# Patient Record
Sex: Female | Born: 1960 | Race: Black or African American | Hispanic: No | Marital: Single | Smoking: Never smoker
Health system: Southern US, Community
[De-identification: ages and names within clinical notes are randomized; demographics above are authoritative.]

## PROBLEM LIST (undated history)

## (undated) HISTORY — PX: ABDOMINAL HYSTERECTOMY: SHX81

---

## 2020-11-20 ENCOUNTER — Other Ambulatory Visit: Payer: Self-pay

## 2020-11-20 ENCOUNTER — Emergency Department (HOSPITAL_BASED_OUTPATIENT_CLINIC_OR_DEPARTMENT_OTHER)
Admission: EM | Admit: 2020-11-20 | Discharge: 2020-11-20 | Disposition: A | Discharge status: Home / Self Care | Payer: BC Managed Care – PPO | Attending: Emergency Medicine | Admitting: Emergency Medicine

## 2020-11-20 ENCOUNTER — Encounter (HOSPITAL_BASED_OUTPATIENT_CLINIC_OR_DEPARTMENT_OTHER): Payer: Self-pay | Admitting: Emergency Medicine

## 2020-11-20 ENCOUNTER — Emergency Department (HOSPITAL_BASED_OUTPATIENT_CLINIC_OR_DEPARTMENT_OTHER): Payer: BC Managed Care – PPO

## 2020-11-20 DIAGNOSIS — R519 Headache, unspecified: Secondary | ICD-10-CM | POA: Insufficient documentation

## 2020-11-20 DIAGNOSIS — R030 Elevated blood-pressure reading, without diagnosis of hypertension: Secondary | ICD-10-CM | POA: Diagnosis not present

## 2020-11-20 DIAGNOSIS — R0789 Other chest pain: Secondary | ICD-10-CM | POA: Diagnosis present

## 2020-11-20 LAB — BASIC METABOLIC PANEL
Anion gap: 9 (ref 5–15)
BUN: 16 mg/dL (ref 6–20)
CO2: 27 mmol/L (ref 22–32)
Calcium: 8.6 mg/dL — ABNORMAL LOW (ref 8.9–10.3)
Chloride: 103 mmol/L (ref 98–111)
Creatinine, Ser: 0.86 mg/dL (ref 0.44–1.00)
GFR, Estimated: >60 mL/min (ref >60)
Glucose, Bld: 102 mg/dL — ABNORMAL HIGH (ref 70–99)
Potassium: 3.8 mmol/L (ref 3.5–5.1)
Sodium: 139 mmol/L (ref 135–145)

## 2020-11-20 LAB — CBC
HCT: 38.2 % (ref 36.0–46.0)
Hemoglobin: 12.4 g/dL (ref 12.0–15.0)
MCH: 26.1 pg (ref 26.0–34.0)
MCHC: 32.5 g/dL (ref 30.0–36.0)
MCV: 80.3 fL (ref 80.0–100.0)
Platelets: 349 10*3/uL (ref 150–400)
RBC: 4.76 MIL/uL (ref 3.87–5.11)
RDW: 14.1 % (ref 11.5–15.5)
WBC: 5.3 10*3/uL (ref 4.0–10.5)
nRBC: 0 % (ref 0.0–0.2)

## 2020-11-20 LAB — TROPONIN I (HIGH SENSITIVITY)
Troponin I (High Sensitivity): 3 ng/L (ref <18)
Troponin I (High Sensitivity): 3 ng/L (ref <18)

## 2020-11-20 MED ORDER — ALUM & MAG HYDROXIDE-SIMETH 200-200-20 MG/5ML PO SUSP
30.0000 mL | Freq: Once | ORAL | Status: AC
  Administered 2020-11-20: 23:00:00 30 mL via ORAL
  Filled 2020-11-20: qty 30

## 2020-11-20 NOTE — ED Triage Notes (Signed)
Headache all day today, reports chest pain tonight.

## 2020-11-20 NOTE — ED Notes (Signed)
Pt to Xray.

## 2020-11-20 NOTE — ED Provider Notes (Signed)
[facility] EMERGENCY DEPARTMENT Provider Note   CSN: [id number] Arrival date & time: 11/20/20  1954     History Chief Complaint  Patient presents with   Chest Pain    Kendra Woods is a 59 y.o. female.  Patient is a 59 year old female who presents with chest pain.  She says that earlier today she started having a headache and took her blood pressure and it was really high.  She says it was like 178/90.  She normally does not have issues with her blood pressure.  She thought that was why her headache was there.  It has improved now.  She also started having some chest pain around noon today.  She describes as a tightness in the center of her chest.  Its nonradiating.  She has no associated shortness of breath.  It is nonexertional.  Its not worse with eating.  There is no associated abdominal pain.  No nausea or vomiting.  No diaphoresis.  No prior history of heart issues.  No history of diabetes or hyperlipidemia or hypertension.  She is a non-smoker.  No family history of early heart disease.        History reviewed. No pertinent past medical history.  There are no problems to display for this patient.   Past Surgical History:  Procedure Laterality Date   ABDOMINAL HYSTERECTOMY       OB History   No obstetric history on file.     No family history on file.  Social History   Tobacco Use   Smoking status: Never Smoker   Smokeless tobacco: Never Used  Substance Use Topics   Alcohol use: Not Currently   Drug use: Not Currently    Home Medications Prior to Admission medications   Not on File    Allergies    Patient has no known allergies.  Review of Systems   Review of Systems  Constitutional: Negative for chills, diaphoresis, fatigue and fever.  HENT: Negative for congestion, rhinorrhea and sneezing.   Eyes: Negative.   Respiratory: Positive for chest tightness. Negative for cough and shortness of breath.   Cardiovascular:  Negative for chest pain and leg swelling.  Gastrointestinal: Negative for abdominal pain, blood in stool, diarrhea, nausea and vomiting.  Genitourinary: Negative for difficulty urinating, flank pain, frequency and hematuria.  Musculoskeletal: Negative for arthralgias and back pain.  Skin: Negative for rash.  Neurological: Negative for dizziness, speech difficulty, weakness, numbness and headaches.    Physical Exam Updated Vital Signs BP (!) 140/113    Pulse (!) 58    Temp 97.6 F (36.4 C) (Oral)    Resp (!) 26    Ht 5\' 4"  (1.626 m)    Wt 98.9 kg    SpO2 99%    BMI 37.42 kg/m   Physical Exam Constitutional:      Appearance: She is well-developed and well-nourished.  HENT:     Head: Normocephalic and atraumatic.  Eyes:     Pupils: Pupils are equal, round, and reactive to light.  Cardiovascular:     Rate and Rhythm: Normal rate and regular rhythm.     Heart sounds: Normal heart sounds.  Pulmonary:     Effort: Pulmonary effort is normal. No respiratory distress.     Breath sounds: Normal breath sounds. No wheezing or rales.  Chest:     Chest wall: No tenderness.  Abdominal:     General: Bowel sounds are normal.     Palpations: Abdomen is soft.  Tenderness: There is no abdominal tenderness. There is no guarding or rebound.  Musculoskeletal:        General: No edema. Normal range of motion.     Cervical back: Normal range of motion and neck supple.     Comments: She has a walking boot on her right leg.  This is from a fracture from snowboarding.  No obvious edema to her extremities  Lymphadenopathy:     Cervical: No cervical adenopathy.  Skin:    General: Skin is warm and dry.     Findings: No rash.  Neurological:     Mental Status: She is alert and oriented to person, place, and time.  Psychiatric:        Mood and Affect: Mood and affect normal.     ED Results / Procedures / Treatments   Labs (all labs ordered are listed, but only abnormal results are displayed) Labs  Reviewed  BASIC METABOLIC PANEL - Abnormal; Notable for the following components:      Result Value   Glucose, Bld 102 (*)    Calcium 8.6 (*)    All other components within normal limits  CBC  TROPONIN I (HIGH SENSITIVITY)  TROPONIN I (HIGH SENSITIVITY)    EKG EKG Interpretation  Date/Time:  Saturday November 20 2020 19:55:28 EST Ventricular Rate:  71 PR Interval:  146 QRS Duration: 80 QT Interval:  382 QTC Calculation: 415 R Axis:   -5 Text Interpretation: Normal sinus rhythm Possible Anterior infarct , age undetermined Abnormal ECG No old tracing to compare Confirmed by Rolan Bucco [phone]) on 11/20/2020 10:29:43 PM   Radiology DG Chest 2 View  Result Date: 11/20/2020 CLINICAL DATA:  Headache and chest pain. EXAM: CHEST - 2 VIEW COMPARISON:  None. FINDINGS: The heart size and mediastinal contours are within normal limits. Both lungs are clear. The visualized skeletal structures are unremarkable. IMPRESSION: No active cardiopulmonary disease. Electronically Signed   By: Aram Candela M.D.   On: 11/20/2020 23:04    Procedures Procedures (including critical care time)  Medications Ordered in ED Medications  alum & mag hydroxide-simeth (MAALOX/MYLANTA) 200-200-20 MG/5ML suspension 30 mL (30 mLs Oral Given 11/20/20 2310)    ED Course  I have reviewed the triage vital signs and the nursing notes.  Pertinent labs & imaging results that were available during my care of the patient were reviewed by me and considered in my medical decision making (see chart for details).    MDM Rules/Calculators/A&P                          Patient 35-year-old female who presents with chest tightness.  She is currently asymptomatic.  She has no ischemic changes on EKG.  She has had 2 - troponins.  Her chest x-ray is clear without evidence of acute abnormality.  No pneumonia or pneumothorax.  This was reviewed by me.  Her chest pain is atypical and only lasts for less than a minute.  It is  nonexertional.  She does not have significant risk factors.  She does not have any pleuritic pain, hypoxia or shortness of breath that would be more concerning for PE.  She does not have symptoms that would be more concerning for aortic dissection.  She is currently chest pain-free.  She was discharged home in good condition.  She was encouraged to follow-up with her PCP.  Return precautions were given.  Her blood pressure has improved in the ED. Final Clinical  Impression(s) / ED Diagnoses Final diagnoses:  Atypical chest pain    Rx / DC Orders ED Discharge Orders    None       Rolan Bucco, MD 11/20/20 2317

## 2022-07-16 IMAGING — CR DG CHEST 2V
2 series · 2 of 2 positions shown · non-contrast
Comparison: None.

CLINICAL DATA: Headache and chest pain.

EXAM:
CHEST - 2 VIEW

[w chest pa]
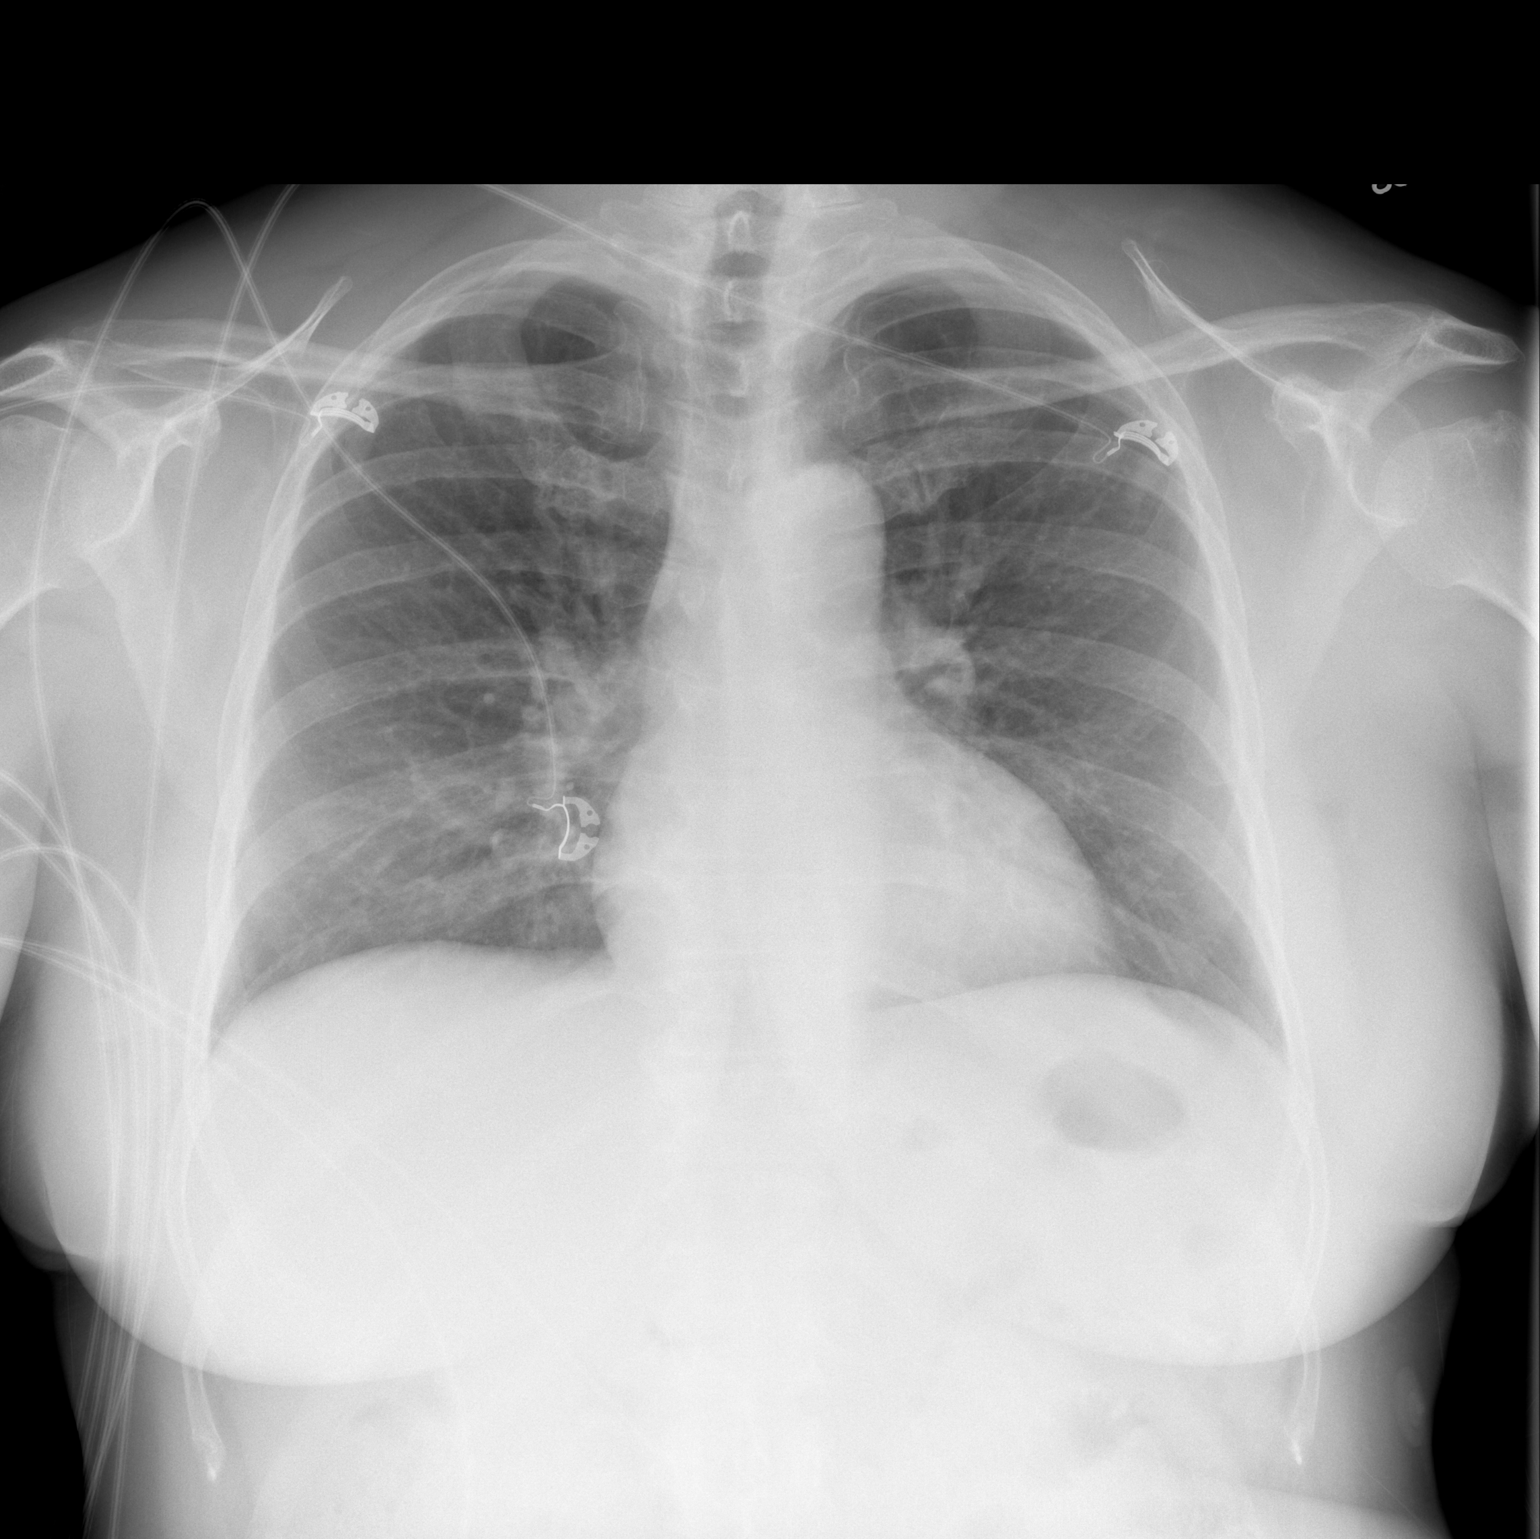

[w chest lat]
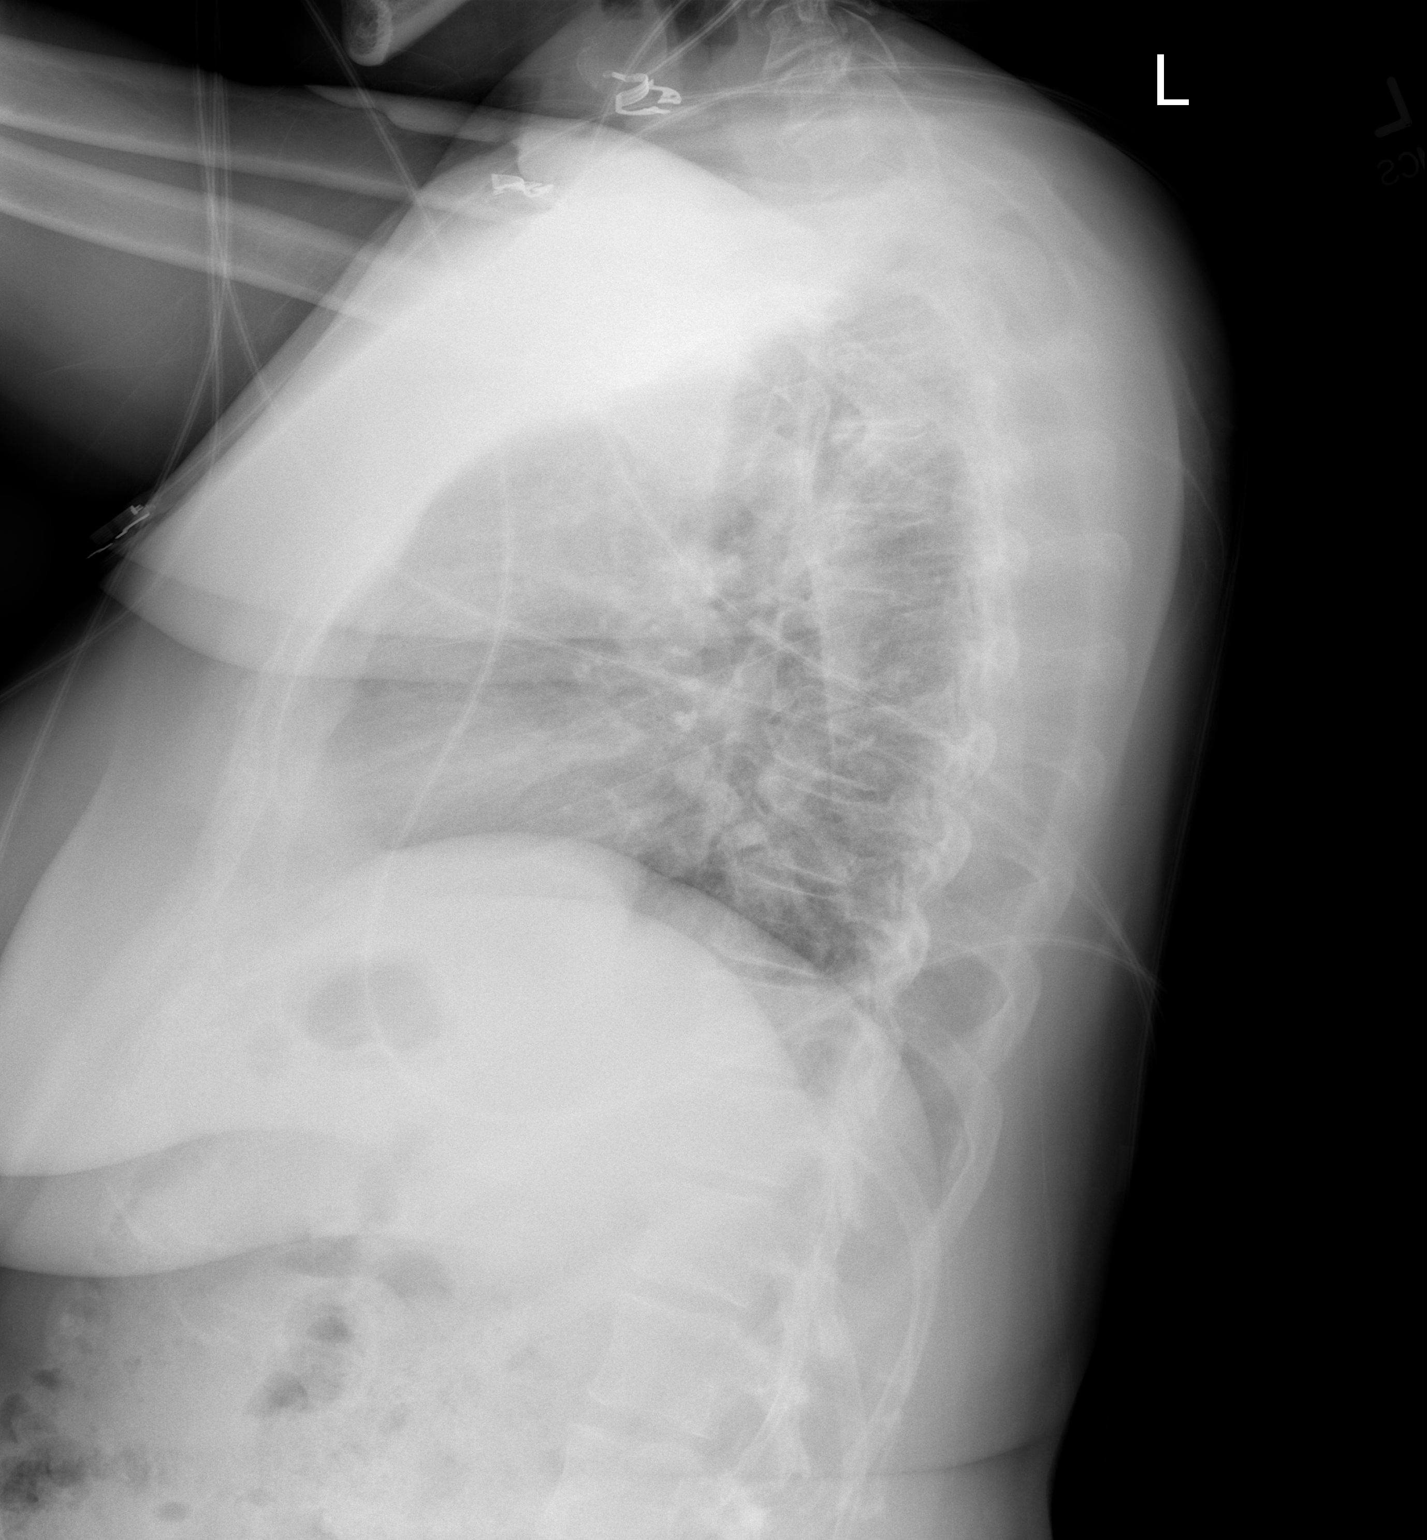

[2 of 2 positions shown; findings below may reference images not displayed]

FINDINGS: The heart size and mediastinal contours are within normal limits.
Both lungs are clear. The visualized skeletal structures are
unremarkable.
IMPRESSION: No active cardiopulmonary disease.
